# Patient Record
Sex: Male | Born: 1970 | Race: White | Hispanic: No | Smoking: Former smoker
Health system: Southern US, Community
[De-identification: ages and names within clinical notes are randomized; demographics above are authoritative.]

## PROBLEM LIST (undated history)

## (undated) HISTORY — PX: NO PAST SURGERIES: SHX2092

---

## 2004-11-16 ENCOUNTER — Emergency Department: Payer: Self-pay | Admitting: Emergency Medicine

## 2009-01-21 ENCOUNTER — Emergency Department: Payer: Self-pay | Admitting: Unknown Physician Specialty

## 2013-08-28 ENCOUNTER — Ambulatory Visit: Payer: Self-pay

## 2014-09-14 IMAGING — CR DG CHEST 2V
1 series · 3 of 3 positions shown · non-contrast
Comparison: None.

CLINICAL DATA: Cough.

EXAM:
CHEST  2 VIEW

[Series 1: pa · 0.17mm/px · 3 of 3 slices shown]
[im 1/3]
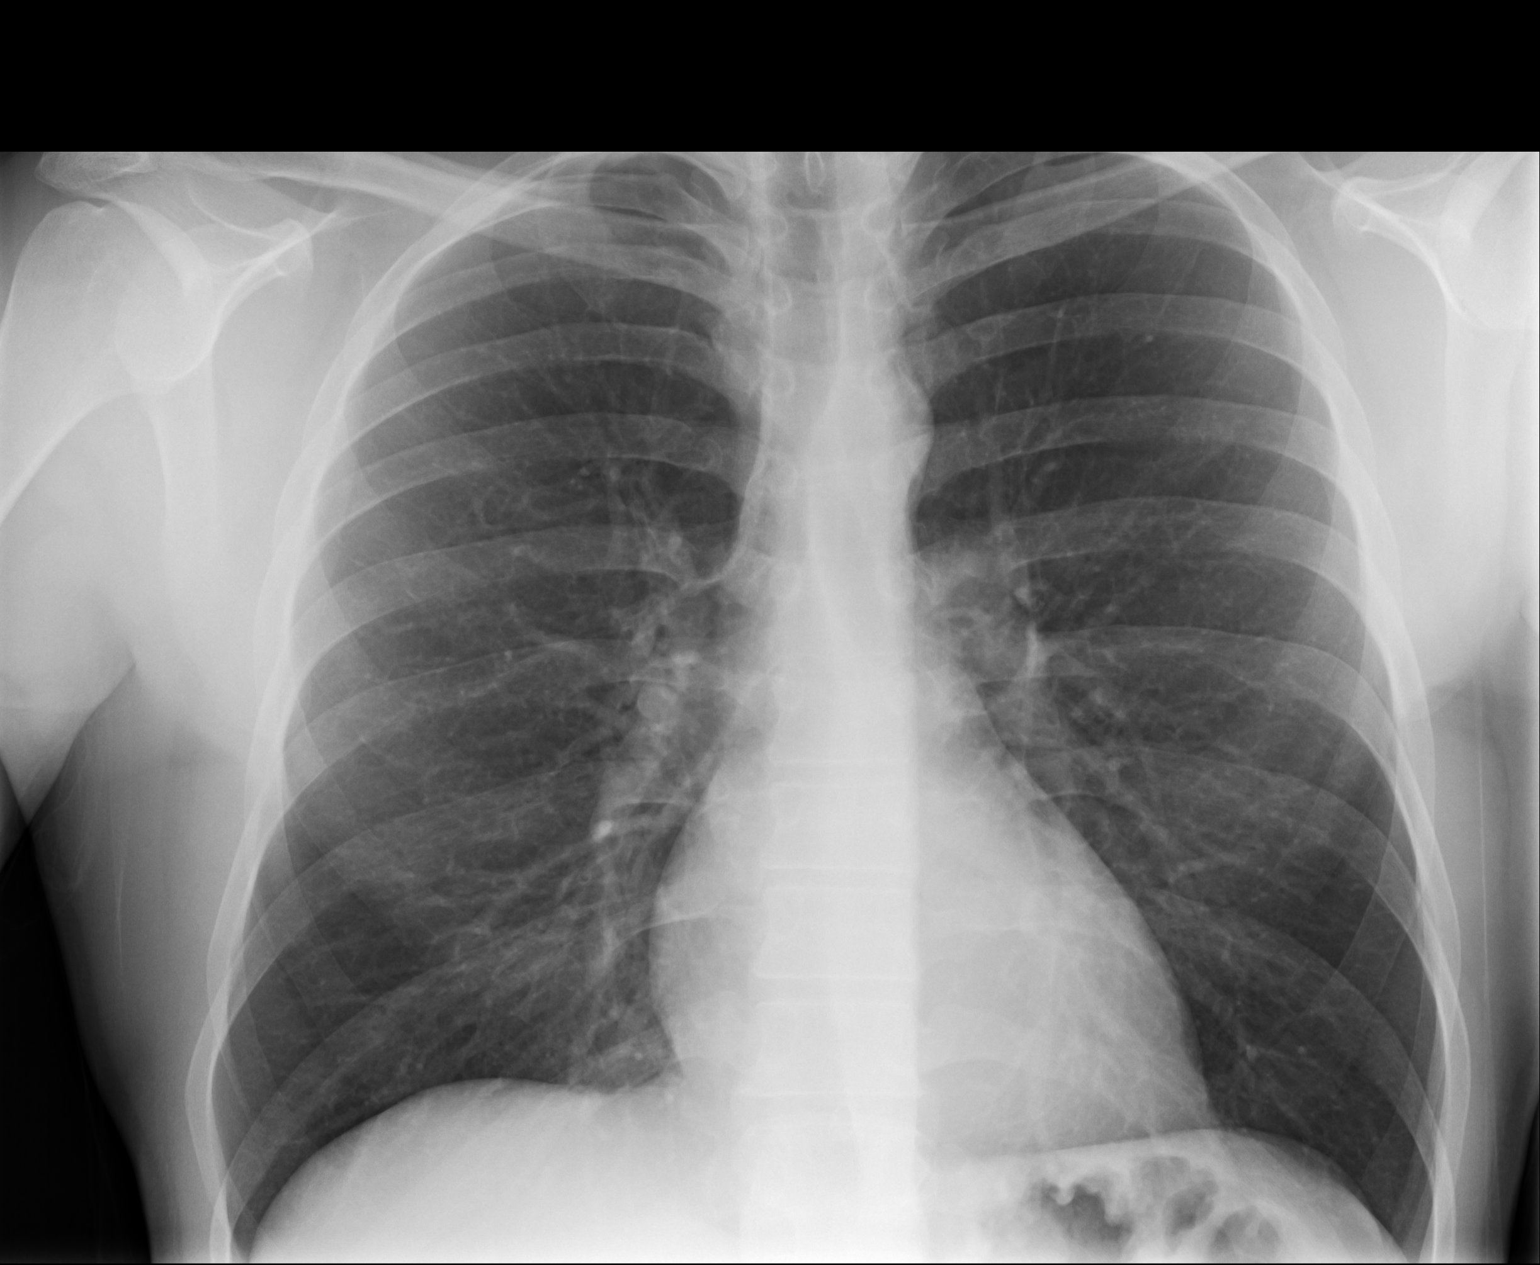
[im 2/3]
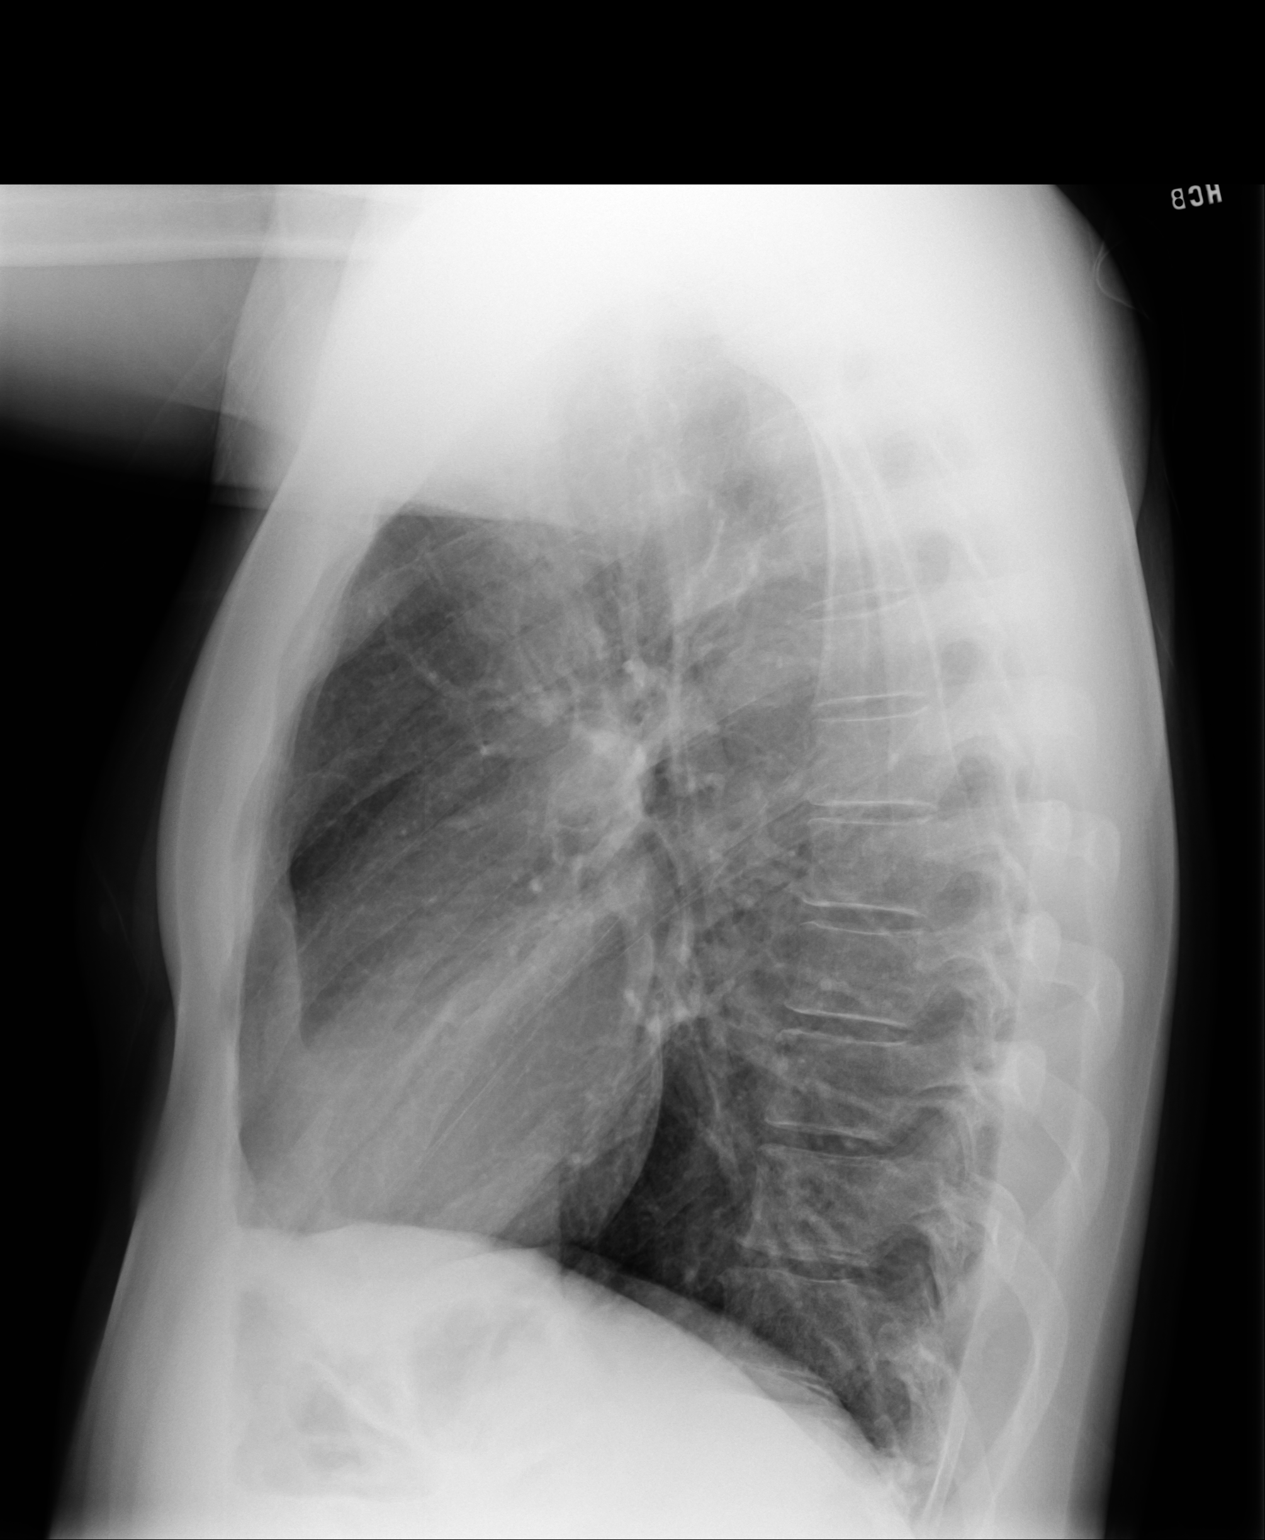
[im 3/3]
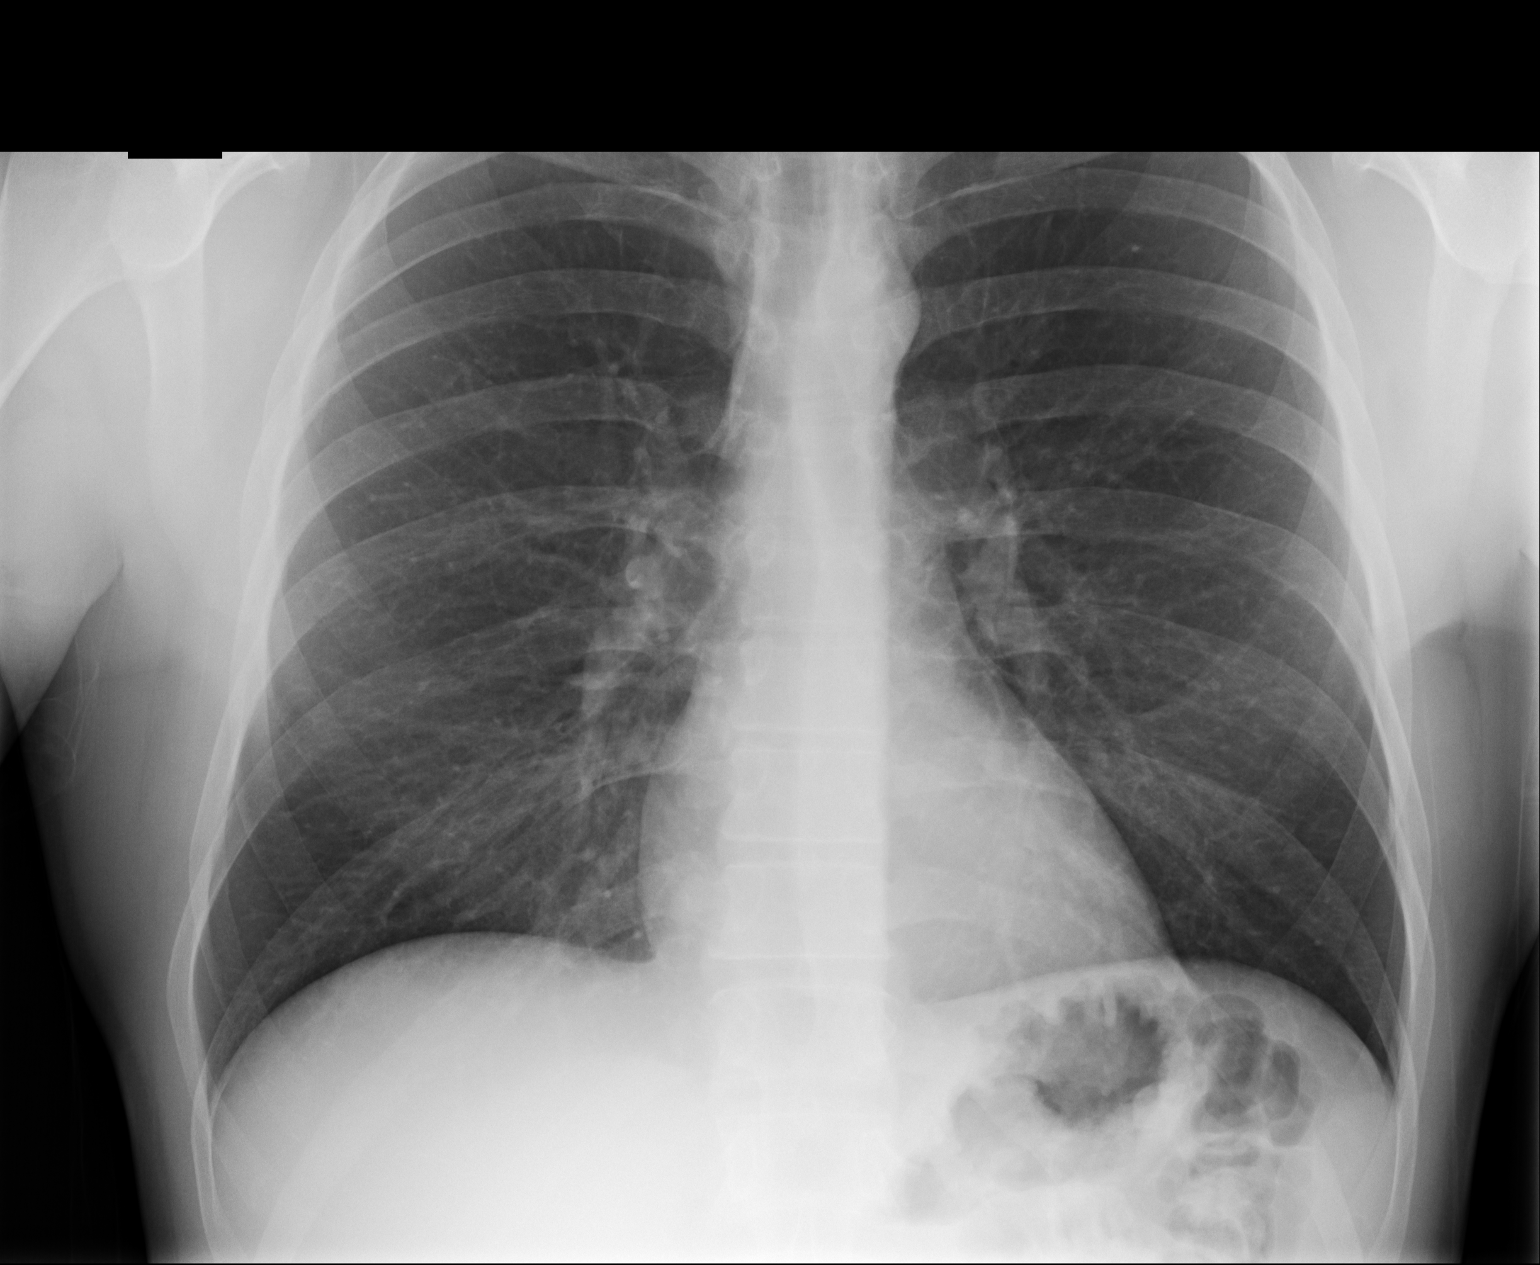

[3 of 3 positions shown; findings below may reference images not displayed]

FINDINGS: The heart size and mediastinal contours are within normal limits.
Both lungs are clear. The visualized skeletal structures are
unremarkable.
IMPRESSION: No active cardiopulmonary disease.

## 2015-04-27 ENCOUNTER — Ambulatory Visit
Admission: EM | Admit: 2015-04-27 | Discharge: 2015-04-27 | Disposition: A | Discharge status: Home / Self Care | Payer: BC Managed Care – PPO | Attending: Family Medicine | Admitting: Family Medicine

## 2015-04-27 ENCOUNTER — Encounter: Payer: Self-pay | Admitting: Emergency Medicine

## 2015-04-27 DIAGNOSIS — S29012A Strain of muscle and tendon of back wall of thorax, initial encounter: Secondary | ICD-10-CM | POA: Diagnosis not present

## 2015-04-27 MED ORDER — CYCLOBENZAPRINE HCL 10 MG PO TABS: 10.0000 mg | ORAL_TABLET | Freq: Every day | ORAL | Status: DC

## 2015-04-27 NOTE — ED Provider Notes (Signed)
CSN: [id number]     Arrival date & time 04/27/15  1408 History   First MD Initiated Contact with Patient 04/27/15 1521     Chief Complaint  Patient presents with  . Back Pain   (Consider location/radiation/quality/duration/timing/severity/associated sxs/prior Treatment) HPI Comments: 44 yo male with a 3 days h/o upper back pains and muscle tightness. States he woke up 3 days ago with the pain. Denies any injury, falls, trauma, fevers, chills, numbness or tingling.   Patient is a 44 y.o. male presenting with back pain. The history is provided by the patient.  Back Pain   History reviewed. No pertinent past medical history. History reviewed. No pertinent past surgical history. History reviewed. No pertinent family history. Social History  Substance Use Topics  . Smoking status: Current Every Day Smoker -- 0.50 packs/day    Types: Cigarettes  . Smokeless tobacco: None  . Alcohol Use: Yes    Review of Systems  Musculoskeletal: Positive for back pain.    Allergies  Review of patient's allergies indicates no known allergies.  Home Medications   Prior to Admission medications   Medication Sig Start Date End Date Taking? Authorizing Provider  cyclobenzaprine (FLEXERIL) 10 MG tablet Take 1 tablet (10 mg total) by mouth at bedtime. 04/27/15   Payton Mccallumrlando Terecia Plaut, MD   Meds Ordered and Administered this Visit  Medications - No data to display  BP 123/85 mmHg  Pulse 100  Temp(Src) 97.7 F (36.5 C) (Tympanic)  Resp 16  Ht 5\' 11"  (1.803 m)  Wt 172 lb (78.019 kg)  BMI 24.00 kg/m2  SpO2 98% No data found.   Physical Exam  Constitutional: He appears well-developed and well-nourished. No distress.  Musculoskeletal: He exhibits tenderness.       Cervical back: He exhibits normal range of motion, no bony tenderness, no swelling, no edema, no deformity and no laceration.  Tenderness to palpation over the trapezius muscles bilaterally and the thoracic paraspinous muscles; no bony  tenderness  Neurological: No cranial nerve deficit.  Skin: No rash noted. He is not diaphoretic.  Nursing note and vitals reviewed.   ED Course  Procedures (including critical care time)  Labs Review Labs Reviewed - No data to display  Imaging Review No results found.   Visual Acuity Review  Right Eye Distance:   Left Eye Distance:   Bilateral Distance:    Right Eye Near:   Left Eye Near:    Bilateral Near:         MDM   1. Upper back strain, initial encounter    Discharge Medication List as of 04/27/2015  3:33 PM    START taking these medications   Details  cyclobenzaprine (FLEXERIL) 10 MG tablet Take 1 tablet (10 mg total) by mouth at bedtime., Starting 04/27/2015, Until Discontinued, Normal      1. diagnosis reviewed with patient 2. rx as per orders above; reviewed possible side effects, interactions, risks and benefits 3. Recommend supportive treatment with otc NSAIDS prn, stretches and heat 4. Follow prn if symptoms worsen or don't improve    Payton Mccallumrlando Adiah Guereca, MD 04/27/15 1542

## 2015-04-27 NOTE — ED Notes (Signed)
Patient c/o upper back pain and muscle spasms for the past 3 days.

## 2016-09-07 ENCOUNTER — Ambulatory Visit
Admission: EM | Admit: 2016-09-07 | Discharge: 2016-09-07 | Disposition: A | Discharge status: Home / Self Care | Payer: BC Managed Care – PPO | Attending: Emergency Medicine | Admitting: Emergency Medicine

## 2016-09-07 DIAGNOSIS — R222 Localized swelling, mass and lump, trunk: Secondary | ICD-10-CM

## 2016-09-07 NOTE — ED Triage Notes (Addendum)
Pt c/o a small knot on his upper left abdomen. It has been there about a year and it recently started to hurt, however he mentions he has been messing with it more lately because it feels like it has gotten bigger. It is completely under the skin. Mainly hurts when he eats, any other time he really doesn't know it is there

## 2016-09-07 NOTE — ED Provider Notes (Signed)
CSN: [id number]     Arrival date & time 09/07/16  [id number] History   First MD Initiated Contact with Patient 09/07/16 [phone]     Chief Complaint  Patient presents with  . Cyst   (Consider location/radiation/quality/duration/timing/severity/associated sxs/prior Treatment) HPI   46 year old male who presents with a one-year history of a firm mass on his left upper quadrant inferior to the ribs and just lateral to the midline. States it is seems to worsen or he eats other times he does not notice it's there. His noticed that it has gotten slightly bigger. He has no injury that he remembers. His never become painful or been associated with any nausea vomiting no fever or chills.       History reviewed. No pertinent past medical history. History reviewed. No pertinent surgical history. History reviewed. No pertinent family history. Social History  Substance Use Topics  . Smoking status: Current Every Day Smoker    Packs/day: 0.50    Types: Cigarettes  . Smokeless tobacco: Never Used  . Alcohol use Yes    Review of Systems  Constitutional: Negative for activity change, appetite change, fatigue and fever.  Gastrointestinal: Positive for abdominal pain. Negative for abdominal distention, constipation, diarrhea, nausea and vomiting.  All other systems reviewed and are negative.   Allergies  Patient has no known allergies.  Home Medications   Prior to Admission medications   Not on File   Meds Ordered and Administered this Visit  Medications - No data to display  BP 117/80 (BP Location: Left Arm)   Pulse 77   Temp 97.6 F (36.4 C) (Oral)   Resp 18   Ht 5\' 11"  (1.803 m)   Wt 192 lb (87.1 kg)   SpO2 99%   BMI 26.78 kg/m  No data found.   Physical Exam  Constitutional: He is oriented to person, place, and time. He appears well-developed and well-nourished. No distress.  HENT:  Head: Normocephalic and atraumatic.  Eyes: EOM are normal. Pupils are equal, round, and reactive  to light.  Neck: Normal range of motion. Neck supple.  Abdominal: Soft. Bowel sounds are normal. He exhibits mass. He exhibits no distension. There is no tenderness. There is no rebound and no guarding. No hernia.  Examination of the abdomen shows no distention. Bowel sounds are present in all quadrants. With palpation , there is a firm round nodule that is just inferior to the ribs and just lateral to the midline in the left upper quadrant. The patient's head listed off the table it is slightly more palpable and tender to the patient. It measures approximately 1 1/2 cm in diameter. It is not reducible. It is nonmobile. Appears to be deep to the abdominal wall. No bruit is appreciated.  Musculoskeletal: Normal range of motion.  Neurological: He is alert and oriented to person, place, and time.  Skin: Skin is warm and dry. He is not diaphoretic.  Psychiatric: He has a normal mood and affect. His behavior is normal. Judgment and thought content normal.  Nursing note and vitals reviewed.   Urgent Care Course     Procedures (including critical care time)  Labs Review Labs Reviewed - No data to display  Imaging Review No results found.   Visual Acuity Review  Right Eye Distance:   Left Eye Distance:   Bilateral Distance:    Right Eye Near:   Left Eye Near:    Bilateral Near:         MDM   1.  Abdominal wall mass    he was reassured that there is nothing ominous at this juncture. I have recommended that he be evaluated by a surgeon.  May Require CT scan or ultrasound for definition. If it does become larger becomes very painful especially if associated with nausea or vomiting he should be seen in the emergency room immediately. He plans to see his primary care physician next month and hopefully will obtain a referral at that time. I did provide him with the name of the surgical group in case he was able to obtain specialist  consultation without going through the primary care.     Lutricia Feil, PA-C 09/07/16 [phone]

## 2018-06-19 ENCOUNTER — Other Ambulatory Visit: Payer: Self-pay

## 2018-06-19 ENCOUNTER — Ambulatory Visit
Admission: EM | Admit: 2018-06-19 | Discharge: 2018-06-19 | Disposition: A | Discharge status: Home / Self Care | Payer: BC Managed Care – PPO | Attending: Family Medicine | Admitting: Family Medicine

## 2018-06-19 DIAGNOSIS — R0981 Nasal congestion: Secondary | ICD-10-CM | POA: Diagnosis not present

## 2018-06-19 DIAGNOSIS — R05 Cough: Secondary | ICD-10-CM

## 2018-06-19 DIAGNOSIS — R69 Illness, unspecified: Secondary | ICD-10-CM

## 2018-06-19 DIAGNOSIS — J069 Acute upper respiratory infection, unspecified: Secondary | ICD-10-CM

## 2018-06-19 MED ORDER — HYDROCOD POLST-CPM POLST ER 10-8 MG/5ML PO SUER: 5.0000 mL | Freq: Two times a day (BID) | ORAL | 0 refills | Status: AC

## 2018-06-19 MED ORDER — BENZONATATE 200 MG PO CAPS: ORAL_CAPSULE | ORAL | 0 refills | Status: AC

## 2018-06-19 NOTE — ED Provider Notes (Signed)
[facility]    CSN: [id number] Arrival date & time: 06/19/18  0810     History   Chief Complaint Chief Complaint  Patient presents with  . Generalized Body Aches    HPI Joe Forbes is a 47 y.o. male.   HPI  -year-old male presents with body aches productive cough of green-brownish phlegm chest congestion chills and sweats.  Started on Monday very suddenly.  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  He states he started with a very severe sore throat that has progressively into his chest.  Not taken his temperature and is actually afebrile today.  Been using NyQuil and DayQuil with some moderate success.  O2 sats 99% on room air temperature is 97.7.          History reviewed. No pertinent past medical history.  There are no active problems to display for this patient.   Past Surgical History:  Procedure Laterality Date  . NO PAST SURGERIES         Home Medications    Prior to Admission medications   Medication Sig Start Date End Date Taking? Authorizing Provider  benzonatate (TESSALON) 200 MG capsule Take one cap TID PRN cough 06/19/18   Lutricia Feil, PA-C  chlorpheniramine-HYDROcodone [facility]) 10-8 MG/5ML SUER Take 5 mLs by mouth 2 (two) times daily. 06/19/18   Lutricia Feil, PA-C    Family History History reviewed. No pertinent family history.  Social History Social History   Tobacco Use  . Smoking status: Former Smoker    Last attempt to quit: 05/16/2016    Years since quitting: 2.0  . Smokeless tobacco: Never Used  Substance Use Topics  . Alcohol use: Not Currently  . Drug use: No     Allergies   Patient has no known allergies.   Review of Systems Review of Systems  Constitutional: Positive for activity change, chills, diaphoresis and fatigue. Negative for appetite change and fever.  HENT: Positive for congestion, postnasal drip and sore throat.   Respiratory:  Positive for cough.   All other systems reviewed and are negative.    Physical Exam Triage Vital Signs ED Triage Vitals  Enc Vitals Group     BP 06/19/18 0820 115/71     Pulse Rate 06/19/18 0820 76     Resp 06/19/18 0820 18     Temp 06/19/18 0820 97.7 F (36.5 C)     Temp Source 06/19/18 0820 Oral     SpO2 06/19/18 0820 99 %     Weight 06/19/18 0819 182 lb (82.6 kg)     Height 06/19/18 0819 5\' 11"  (1.803 m)     Head Circumference --      Peak Flow --      Pain Score 06/19/18 0819 0     Pain Loc --      Pain Edu? --      Excl. in GC? --    No data found.  Updated Vital Signs BP 115/71 (BP Location: Left Arm)   Pulse 76   Temp 97.7 F (36.5 C) (Oral)   Resp 18   Ht 5\' 11"  (1.803 m)   Wt 182 lb (82.6 kg)   SpO2 99%   BMI 25.38 kg/m   Visual Acuity Right Eye Distance:   Left Eye Distance:   Bilateral Distance:    Right Eye Near:   Left Eye Near:    Bilateral Near:  Physical Exam  Constitutional: He is oriented to person, place, and time. He appears well-developed and well-nourished. No distress.  HENT:  Head: Normocephalic.  Right Ear: External ear normal.  Left Ear: External ear normal.  Nose: Nose normal.  Mouth/Throat: Oropharynx is clear and moist. No oropharyngeal exudate.  Eyes: Pupils are equal, round, and reactive to light. Right eye exhibits no discharge. Left eye exhibits no discharge.  Neck: Normal range of motion.  Pulmonary/Chest: Effort normal and breath sounds normal.  Musculoskeletal: Normal range of motion.  Lymphadenopathy:    He has no cervical adenopathy.  Neurological: He is alert and oriented to person, place, and time.  Skin: Skin is warm and dry. He is not diaphoretic.  Psychiatric: He has a normal mood and affect. His behavior is normal. Judgment and thought content normal.  Nursing note and vitals reviewed.    UC Treatments / Results  Labs (all labs ordered are listed, but only abnormal results are displayed) Labs  Reviewed - No data to display  EKG None  Radiology No results found.  Procedures Procedures (including critical care time)  Medications Ordered in UC Medications - No data to display  Initial Impression / Assessment and Plan / UC Course  I have reviewed the triage vital signs and the nursing notes.  Pertinent labs & imaging results that were available during my care of the patient were reviewed by me and considered in my medical decision making (see chart for details).     Told patient this is likely a viral illness.  Treat his coughing symptomatically.  Him out of work for the next 2 days.  If not improving follow-up with primary care physician Final Clinical Impressions(s) / UC Diagnoses   Final diagnoses:  Upper respiratory tract infection, unspecified type   Discharge Instructions   None    ED Prescriptions    Medication Sig Dispense Auth. Provider   chlorpheniramine-HYDROcodone (TUSSIONEX PENNKINETIC ER) 10-8 MG/5ML SUER Take 5 mLs by mouth 2 (two) times daily. 115 mL Ovid Curdoemer, William P, PA-C   benzonatate (TESSALON) 200 MG capsule Take one cap TID PRN cough 30 capsule Lutricia Feiloemer, William P, PA-C     Controlled Substance Prescriptions [place] Controlled Substance Registry consulted? Not Applicable   Lutricia FeilRoemer, William P, PA-C 06/19/18 [phone]

## 2018-06-19 NOTE — ED Triage Notes (Signed)
Patient states that since Monday he has been having body aches, cough, chest congestion, chills, and sweats.
